# Patient Record
Sex: Female | Born: 1975
Health system: Southern US, Community
[De-identification: ages and names within clinical notes are randomized; demographics above are authoritative.]

## PROBLEM LIST (undated history)

## (undated) DIAGNOSIS — J329 Chronic sinusitis, unspecified: Secondary | ICD-10-CM

## (undated) HISTORY — PX: TONSILLECTOMY: SUR1361

---

## 1999-07-31 ENCOUNTER — Emergency Department (HOSPITAL_COMMUNITY): Admission: EM | Admit: 1999-07-31 | Discharge: 1999-07-31 | Payer: Self-pay | Admitting: Emergency Medicine

## 1999-11-06 ENCOUNTER — Emergency Department (HOSPITAL_COMMUNITY): Admission: EM | Admit: 1999-11-06 | Discharge: 1999-11-06 | Payer: Self-pay | Admitting: Emergency Medicine

## 1999-11-16 ENCOUNTER — Emergency Department (HOSPITAL_COMMUNITY): Admission: EM | Admit: 1999-11-16 | Discharge: 1999-11-16 | Payer: Self-pay | Admitting: Emergency Medicine

## 1999-11-19 ENCOUNTER — Emergency Department (HOSPITAL_COMMUNITY): Admission: EM | Admit: 1999-11-19 | Discharge: 1999-11-19 | Payer: Self-pay | Admitting: Emergency Medicine

## 2000-01-13 ENCOUNTER — Encounter: Payer: Self-pay | Admitting: Family Medicine

## 2000-01-13 ENCOUNTER — Ambulatory Visit (HOSPITAL_COMMUNITY): Admission: RE | Admit: 2000-01-13 | Discharge: 2000-01-13 | Payer: Self-pay | Admitting: Family Medicine

## 2000-03-03 ENCOUNTER — Encounter: Payer: Self-pay | Admitting: Family Medicine

## 2000-03-03 ENCOUNTER — Ambulatory Visit (HOSPITAL_COMMUNITY): Admission: RE | Admit: 2000-03-03 | Discharge: 2000-03-03 | Payer: Self-pay | Admitting: Family Medicine

## 2000-05-20 ENCOUNTER — Inpatient Hospital Stay (HOSPITAL_COMMUNITY): Admission: AD | Admit: 2000-05-20 | Discharge: 2000-05-20 | Payer: Self-pay | Admitting: Family Medicine

## 2000-05-23 ENCOUNTER — Observation Stay (HOSPITAL_COMMUNITY): Admission: AD | Admit: 2000-05-23 | Discharge: 2000-05-24 | Payer: Self-pay | Admitting: Family Medicine

## 2000-05-24 ENCOUNTER — Encounter: Payer: Self-pay | Admitting: Family Medicine

## 2000-06-22 ENCOUNTER — Encounter: Payer: Self-pay | Admitting: Family Medicine

## 2000-06-22 ENCOUNTER — Ambulatory Visit (HOSPITAL_COMMUNITY): Admission: RE | Admit: 2000-06-22 | Discharge: 2000-06-22 | Payer: Self-pay | Admitting: Family Medicine

## 2000-07-29 ENCOUNTER — Observation Stay (HOSPITAL_COMMUNITY): Admission: AD | Admit: 2000-07-29 | Discharge: 2000-07-29 | Payer: Self-pay | Admitting: Family Medicine

## 2000-08-01 ENCOUNTER — Inpatient Hospital Stay (HOSPITAL_COMMUNITY): Admission: AD | Admit: 2000-08-01 | Discharge: 2000-08-03 | Payer: Self-pay | Admitting: Family Medicine

## 2001-04-04 ENCOUNTER — Encounter: Payer: Self-pay | Admitting: Emergency Medicine

## 2001-04-04 ENCOUNTER — Emergency Department (HOSPITAL_COMMUNITY): Admission: EM | Admit: 2001-04-04 | Discharge: 2001-04-04 | Payer: Self-pay | Admitting: Emergency Medicine

## 2001-05-09 ENCOUNTER — Inpatient Hospital Stay (HOSPITAL_COMMUNITY): Admission: AD | Admit: 2001-05-09 | Discharge: 2001-05-09 | Payer: Self-pay | Admitting: Family Medicine

## 2001-05-10 ENCOUNTER — Other Ambulatory Visit: Admission: RE | Admit: 2001-05-10 | Discharge: 2001-05-10 | Payer: Self-pay | Admitting: Family Medicine

## 2003-07-19 ENCOUNTER — Emergency Department (HOSPITAL_COMMUNITY): Admission: EM | Admit: 2003-07-19 | Discharge: 2003-07-19 | Payer: Self-pay | Admitting: Emergency Medicine

## 2004-02-07 ENCOUNTER — Other Ambulatory Visit: Admission: RE | Admit: 2004-02-07 | Discharge: 2004-02-07 | Payer: Self-pay | Admitting: Family Medicine

## 2004-09-11 ENCOUNTER — Emergency Department (HOSPITAL_COMMUNITY): Admission: EM | Admit: 2004-09-11 | Discharge: 2004-09-11 | Payer: Self-pay | Admitting: Family Medicine

## 2005-02-04 ENCOUNTER — Ambulatory Visit (HOSPITAL_COMMUNITY): Admission: RE | Admit: 2005-02-04 | Discharge: 2005-02-04 | Payer: Self-pay | Admitting: Obstetrics and Gynecology

## 2005-04-05 ENCOUNTER — Ambulatory Visit (HOSPITAL_COMMUNITY): Admission: RE | Admit: 2005-04-05 | Discharge: 2005-04-05 | Payer: Self-pay | Admitting: Obstetrics and Gynecology

## 2006-01-10 ENCOUNTER — Emergency Department (HOSPITAL_COMMUNITY): Admission: EM | Admit: 2006-01-10 | Discharge: 2006-01-10 | Payer: Self-pay | Admitting: Family Medicine

## 2006-11-05 ENCOUNTER — Emergency Department (HOSPITAL_COMMUNITY): Admission: EM | Admit: 2006-11-05 | Discharge: 2006-11-05 | Payer: Self-pay | Admitting: Emergency Medicine

## 2007-06-04 ENCOUNTER — Emergency Department (HOSPITAL_COMMUNITY): Admission: EM | Admit: 2007-06-04 | Discharge: 2007-06-04 | Payer: Self-pay | Admitting: Family Medicine

## 2008-01-24 ENCOUNTER — Emergency Department (HOSPITAL_COMMUNITY): Admission: EM | Admit: 2008-01-24 | Discharge: 2008-01-24 | Payer: Self-pay | Admitting: Emergency Medicine

## 2009-07-14 ENCOUNTER — Emergency Department (HOSPITAL_COMMUNITY): Admission: EM | Admit: 2009-07-14 | Discharge: 2009-07-14 | Payer: Self-pay | Admitting: Emergency Medicine

## 2009-08-10 ENCOUNTER — Emergency Department (HOSPITAL_COMMUNITY): Admission: EM | Admit: 2009-08-10 | Discharge: 2009-08-10 | Payer: Self-pay | Admitting: Emergency Medicine

## 2010-10-09 ENCOUNTER — Emergency Department (HOSPITAL_COMMUNITY)
Admission: EM | Admit: 2010-10-09 | Discharge: 2010-10-09 | Payer: Self-pay | Source: Home / Self Care | Admitting: Emergency Medicine

## 2010-12-30 LAB — POCT PREGNANCY, URINE: Preg Test, Ur: NEGATIVE

## 2011-02-12 NOTE — Discharge Summary (Signed)
[facility]  Patient:    Anne Booth, Anne Booth                          MRN: [id number] Adm. Date:  [id number] Disc. Date: [id number] Attending:  Orpah Greek                           Discharge Summary  DATE OF BIRTH:                Dec 20, 1975  ADMISSION DIAGNOSIS:          Twenty-nine and 6/7 weeks known beta strep                               carrier preterm labor.  DISCHARGE DIAGNOSIS:          Thirty weeks pregnant preterm labor, unstable                               known beta strep carrier.  PROCEDURES:                   Ultrasound on May 24, 2000 revealing a 30 week fetus, estimated weight of 1481 grams which is 38% for 30 weeks.  AFI of 10.6 with an anterior placenta grade 2 well above the cervix.  Cervical length was 3.2 cm transvaginally.  No abnormalities were seen.  The patient was admitted after routine follow up in the office to assess the cervical length.  The patient had been treated with Amoxicillin two weeks prior for cervical softening, and had been evaluated in [facility] Triage three days prior with complaints of uterine cramping and contractions. The patient was found to have no cervical change at that visit and no uterine activity and was discharged home on no medications.  She did not take Amoxicillin that was prescribed regularly, at most two times a day and had not finished the treatment course at the time of presentation yesterday.  Was found to have a cervical exam of 1 cm dilated to the internal os, long and soft and was admitted for IV antibiotics, possible tocolysis and possible betamethasone.  The patient was started on Unasyn 3 grams IV q.6h. and observed on continuous fetal and toco monitor.  She exhibited no uterine activity whatsoever during hospitalization and received no tocolysis or betamethasone.  After 12 hours in the hospital, cervical exam remained stable and a tight 1 cm long and moderately firm  without any lower uterine segment development. It was felt that patient could be cared for in a home setting with home IV antibiotics and bedrest, back/pelvic rest.  PLAN AT TIME OF ADMISSION:    Discharge was discussed with Dr. Corky Sox, attending obstetrician per hospital admitting privileges.  The patient was discharged home on Unasyn 3 gm IV q.6h. through home health, strict bed and pelvic rest and was to follow up in the office on Friday, May 27, 2000 at 10:45 a.m., sooner if uterine activity developed. DD:  05/24/00 TD:  05/25/00 Job: [id number]

## 2013-06-19 ENCOUNTER — Encounter (HOSPITAL_BASED_OUTPATIENT_CLINIC_OR_DEPARTMENT_OTHER): Payer: Self-pay | Admitting: *Deleted

## 2013-06-19 ENCOUNTER — Emergency Department (HOSPITAL_BASED_OUTPATIENT_CLINIC_OR_DEPARTMENT_OTHER): Payer: Medicaid Other

## 2013-06-19 ENCOUNTER — Emergency Department (HOSPITAL_BASED_OUTPATIENT_CLINIC_OR_DEPARTMENT_OTHER)
Admission: EM | Admit: 2013-06-19 | Discharge: 2013-06-19 | Disposition: A | Discharge status: Home / Self Care | Payer: Medicaid Other | Attending: Emergency Medicine | Admitting: Emergency Medicine

## 2013-06-19 DIAGNOSIS — Z79899 Other long term (current) drug therapy: Secondary | ICD-10-CM | POA: Insufficient documentation

## 2013-06-19 DIAGNOSIS — F17200 Nicotine dependence, unspecified, uncomplicated: Secondary | ICD-10-CM | POA: Insufficient documentation

## 2013-06-19 DIAGNOSIS — R079 Chest pain, unspecified: Secondary | ICD-10-CM | POA: Insufficient documentation

## 2013-06-19 DIAGNOSIS — J40 Bronchitis, not specified as acute or chronic: Secondary | ICD-10-CM

## 2013-06-19 MED ORDER — IBUPROFEN 800 MG PO TABS
800.0000 mg | ORAL_TABLET | Freq: Once | ORAL | Status: AC
  Administered 2013-06-19: 800 mg via ORAL
  Filled 2013-06-19: qty 1

## 2013-06-19 MED ORDER — ALBUTEROL SULFATE (5 MG/ML) 0.5% IN NEBU
5.0000 mg | INHALATION_SOLUTION | Freq: Once | RESPIRATORY_TRACT | Status: AC
  Administered 2013-06-19: 5 mg via RESPIRATORY_TRACT

## 2013-06-19 MED ORDER — ALBUTEROL SULFATE (5 MG/ML) 0.5% IN NEBU
INHALATION_SOLUTION | RESPIRATORY_TRACT | Status: AC
  Filled 2013-06-19: qty 1

## 2013-06-19 MED ORDER — ALBUTEROL SULFATE HFA 108 (90 BASE) MCG/ACT IN AERS
2.0000 | INHALATION_SPRAY | Freq: Once | RESPIRATORY_TRACT | Status: AC
  Administered 2013-06-19: 2 via RESPIRATORY_TRACT
  Filled 2013-06-19: qty 6.7

## 2013-06-19 MED ORDER — ALBUTEROL SULFATE HFA 108 (90 BASE) MCG/ACT IN AERS: 2.0000 | INHALATION_SPRAY | RESPIRATORY_TRACT | Status: DC | PRN

## 2013-06-19 MED ORDER — IPRATROPIUM BROMIDE 0.02 % IN SOLN
0.5000 mg | Freq: Once | RESPIRATORY_TRACT | Status: AC
  Administered 2013-06-19: 0.5 mg via RESPIRATORY_TRACT
  Filled 2013-06-19: qty 2.5

## 2013-06-19 NOTE — Patient Instructions (Signed)
Instructed patient on the proper use of administering albuteral mdi via aerochamber patient tolerated well 

## 2013-06-19 NOTE — ED Notes (Signed)
Reports she has been sick for 2 weeks with cough and congestion- Tonight reports increased SOB, chest pain with cough and deep breath

## 2013-06-19 NOTE — ED Provider Notes (Signed)
CSN: [id number]     Arrival date & time 06/19/13  2124 History   This chart was scribed for Audree Camel, MD by Valera Castle, ED Scribe. This patient was seen in room MH10/MH10 and the patient's care was started at 9:42 PM.    Chief Complaint  Patient presents with  . Shortness of Breath    Patient is a 37 y.o. female presenting with shortness of breath. The history is provided by the patient. No language interpreter was used.  Shortness of Breath Severity:  Moderate Onset quality:  Sudden Duration: Earlier tonight. Timing:  Intermittent Chronicity:  New Context comment:  Recent congestion and cough. Pt is a PPD smoker.  Associated symptoms: chest pain and cough   Associated symptoms: no fever (Pt reports feeling warm today. No temp recorded.)   Associated symptoms comment:  Congestion. Chest pain:    Severity:  Moderate   Onset quality:  Gradual   Duration:  2 weeks   Timing:  Intermittent   Chronicity:  New  HPI Comments: Anne Booth is a 37 y.o. female who presents to the Emergency Department complaining of gradually increasing, moderate, intermittent SOB, onset earlier tonight. She reports associated congestion and central chest pain when coughing and deep breathing, onset 2 weeks ago. She reports that the coughing was unproductive, and that the congestion started in her sinuses, and then gradually moved down into her chest. She denies currently having congestion in her sinuses. She states that she started feeling better last week, but that the symptoms worsened again. She reports that movement and activity excaberates the SOB and chest pain. She states she felt warm earlier today and that she might have a fever. She states that she used her inhaler initially, with some relief, but then stopped using it. She also reports taking mucinex and nyquil, with no relief. She reports having bronchitis years ago and that this current pain is much worse than that, but denies a h/o COPD. She  denies rhinorrhea, and any other associated symptoms. She has no known allergies, and no pertinent medical history. She is a PPD smoker, but denies EtOH.    History reviewed. No pertinent past medical history. Past Surgical History  Procedure Laterality Date  . Tonsillectomy     No family history on file. History  Substance Use Topics  . Smoking status: Current Every Day Smoker -- 1.00 packs/day    Types: Cigarettes  . Smokeless tobacco: Never Used  . Alcohol Use: No   OB History   Grav Para Term Preterm Abortions TAB SAB Ect Mult Living                 Review of Systems  Constitutional: Negative for fever (Pt reports feeling warm today. No temp recorded.).  HENT: Positive for congestion. Negative for rhinorrhea.   Respiratory: Positive for cough and shortness of breath.   Cardiovascular: Positive for chest pain.  All other systems reviewed and are negative.    Allergies  Review of patient's allergies indicates no known allergies.  Home Medications   Current Outpatient Rx  Name  Route  Sig  Dispense  Refill  . guaiFENesin (MUCINEX) 600 MG 12 hr tablet   Oral   Take 1,200 mg by mouth 2 (two) times daily.         . Pseudoeph-Doxylamine-DM-APAP (NYQUIL PO)   Oral   Take by mouth.          Triage Vitals: BP 153/89  Pulse 94  Temp(Src)  98 F (36.7 C) (Oral)  Resp 16  Ht 5\' 11"  (1.803 m)  Wt 260 lb (117.935 kg)  BMI 36.28 kg/m2  SpO2 94%  LMP 05/11/2013  Physical Exam  Nursing note and vitals reviewed. Constitutional: She is oriented to person, place, and time. She appears well-developed and well-nourished. No distress.  HENT:  Head: Normocephalic and atraumatic.  Mouth/Throat: Oropharynx is clear and moist.  Oral pharynx clear. No erythema. No nasal congestion.   Eyes: EOM are normal.  Neck: Neck supple. No tracheal deviation present.  Cardiovascular: Normal rate, regular rhythm and normal heart sounds.   Pulmonary/Chest: Effort normal. No respiratory  distress. She has wheezes (Diffuse wheezing. ). She exhibits no tenderness.  Abdominal: Soft. There is no tenderness.  No abdominal tenderness.   Musculoskeletal: Normal range of motion.  Neurological: She is alert and oriented to person, place, and time.  Skin: Skin is warm and dry.  Psychiatric: She has a normal mood and affect. Her behavior is normal.    ED Course  Procedures (including critical care time)  DIAGNOSTIC STUDIES: Oxygen Saturation is 94% on room air, adequate by my interpretation.    COORDINATION OF CARE: 9:45 PM-Discussed treatment plan which includes CXR, EKG, ibuprofen, albuterol, and ipratropium with pt at bedside and pt agreed to plan.     Date: 06/19/2013  Rate: 75  Rhythm: normal sinus rhythm  QRS Axis: normal  Intervals: normal  ST/T Wave abnormalities: normal  Conduction Disutrbances:none  Narrative Interpretation: Normal EKG  Old EKG Reviewed: none available   Labs Review Labs Reviewed - No data to display Imaging Review Dg Chest 2 View  06/19/2013   CLINICAL DATA:  Shortness of breath, wheezing, cough and chest pain.  EXAM: CHEST  2 VIEW  COMPARISON:  None.  FINDINGS: Normal heart size. Approximately 2 cm with thin edge along the left upper mediastinum. Central bronchitic changes without focal infiltrate, edema, effusion, or pneumothorax.  IMPRESSION: 1. Bronchitic changes. 2. Probable bleb along the upper left mediastinum.   Electronically Signed   By: Tiburcio Pea   On: 06/19/2013 22:59    MDM   1. Bronchitis    Symptomatically improved with NSAIDs and albuterol treatment. Her EKG is not concerning for ACS. Her symptoms this is with a viral URI. Will treat symptomatically with bronchitis but there is no sign of bacterial pneumonia. Will treat with albuterol at home as well as other supportive care measures. Discussed smoking cessation as well. Her airways opened up significantly after 2 albuterol treatments. Will give an inhaler here for use at  home.    I personally performed the services described in this documentation, which was scribed in my presence. The recorded information has been reviewed and is accurate.    Audree Camel, MD 06/19/13 2308

## 2013-08-29 ENCOUNTER — Encounter (HOSPITAL_BASED_OUTPATIENT_CLINIC_OR_DEPARTMENT_OTHER): Payer: Self-pay | Admitting: Emergency Medicine

## 2013-08-29 ENCOUNTER — Emergency Department (HOSPITAL_BASED_OUTPATIENT_CLINIC_OR_DEPARTMENT_OTHER): Payer: Medicaid Other

## 2013-08-29 ENCOUNTER — Emergency Department (HOSPITAL_BASED_OUTPATIENT_CLINIC_OR_DEPARTMENT_OTHER)
Admission: EM | Admit: 2013-08-29 | Discharge: 2013-08-29 | Disposition: A | Discharge status: Home / Self Care | Payer: Medicaid Other | Attending: Emergency Medicine | Admitting: Emergency Medicine

## 2013-08-29 DIAGNOSIS — F17200 Nicotine dependence, unspecified, uncomplicated: Secondary | ICD-10-CM | POA: Insufficient documentation

## 2013-08-29 DIAGNOSIS — R109 Unspecified abdominal pain: Secondary | ICD-10-CM | POA: Insufficient documentation

## 2013-08-29 DIAGNOSIS — M549 Dorsalgia, unspecified: Secondary | ICD-10-CM | POA: Insufficient documentation

## 2013-08-29 DIAGNOSIS — Z3202 Encounter for pregnancy test, result negative: Secondary | ICD-10-CM | POA: Insufficient documentation

## 2013-08-29 DIAGNOSIS — Z79899 Other long term (current) drug therapy: Secondary | ICD-10-CM | POA: Insufficient documentation

## 2013-08-29 LAB — PREGNANCY, URINE: Preg Test, Ur: NEGATIVE

## 2013-08-29 LAB — URINALYSIS, ROUTINE W REFLEX MICROSCOPIC
Glucose, UA: NEGATIVE mg/dL
Hgb urine dipstick: NEGATIVE
Specific Gravity, Urine: 1.006 (ref 1.005–1.030)
pH: 6.5 (ref 5.0–8.0)

## 2013-08-29 LAB — URINE MICROSCOPIC-ADD ON

## 2013-08-29 MED ORDER — IBUPROFEN 800 MG PO TABS: 800.0000 mg | ORAL_TABLET | Freq: Three times a day (TID) | ORAL | Status: DC

## 2013-08-29 MED ORDER — HYDROCODONE-ACETAMINOPHEN 5-325 MG PO TABS: 2.0000 | ORAL_TABLET | ORAL | Status: DC | PRN

## 2013-08-29 MED ORDER — HYDROCODONE-ACETAMINOPHEN 5-325 MG PO TABS
2.0000 | ORAL_TABLET | Freq: Once | ORAL | Status: AC
  Administered 2013-08-29: 2 via ORAL
  Filled 2013-08-29: qty 2

## 2013-08-29 NOTE — ED Provider Notes (Signed)
CSN: [id number]     Arrival date & time 08/29/13  1743 History   First MD Initiated Contact with Patient 08/29/13 1752     Chief Complaint  Patient presents with  . Urinary Frequency   (Consider location/radiation/quality/duration/timing/severity/associated sxs/prior Treatment) Patient is a 37 y.o. female presenting with frequency. The history is provided by the patient. No language interpreter was used.  Urinary Frequency This is a new problem. The current episode started today. The problem occurs constantly. The problem has been unchanged. Associated symptoms include abdominal pain. Nothing aggravates the symptoms. She has tried nothing for the symptoms. The treatment provided moderate relief.  Pt reports she ha had lower abdominal pain for 3 days.   History reviewed. No pertinent past medical history. Past Surgical History  Procedure Laterality Date  . Tonsillectomy     History reviewed. No pertinent family history. History  Substance Use Topics  . Smoking status: Current Every Day Smoker -- 1.00 packs/day    Types: Cigarettes  . Smokeless tobacco: Never Used  . Alcohol Use: No   OB History   Grav Para Term Preterm Abortions TAB SAB Ect Mult Living                 Review of Systems  Gastrointestinal: Positive for abdominal pain.  Genitourinary: Positive for frequency.  Musculoskeletal: Positive for back pain.  All other systems reviewed and are negative.    Allergies  Review of patient's allergies indicates no known allergies.  Home Medications   Current Outpatient Rx  Name  Route  Sig  Dispense  Refill  . albuterol (PROVENTIL HFA;VENTOLIN HFA) 108 (90 BASE) MCG/ACT inhaler   Inhalation   Inhale 2 puffs into the lungs every 4 (four) hours as needed for wheezing or shortness of breath.   1 Inhaler   0   . guaiFENesin (MUCINEX) 600 MG 12 hr tablet   Oral   Take 1,200 mg by mouth 2 (two) times daily.         . Pseudoeph-Doxylamine-DM-APAP (NYQUIL PO)    Oral   Take by mouth.          BP 148/86  Pulse 87  Temp(Src) 98.3 F (36.8 C) (Oral)  Resp 16  Ht 5\' 11"  (1.803 m)  Wt 250 lb (113.399 kg)  BMI 34.88 kg/m2  SpO2 100% Physical Exam  Nursing note and vitals reviewed. Constitutional: She appears well-developed and well-nourished.  HENT:  Head: Normocephalic and atraumatic.  Right Ear: External ear normal.  Left Ear: External ear normal.  Mouth/Throat: Oropharynx is clear and moist.  Eyes: Pupils are equal, round, and reactive to light.  Neck: Normal range of motion.  Cardiovascular: Normal rate and regular rhythm.   Pulmonary/Chest: Effort normal and breath sounds normal.  Abdominal: Soft. There is tenderness.  cva tenderness right flank  Musculoskeletal: Normal range of motion.  Neurological: She is alert.  Skin: Skin is warm.  Psychiatric: She has a normal mood and affect.    ED Course  Procedures (including critical care time) Labs Review Labs Reviewed  URINALYSIS, ROUTINE W REFLEX MICROSCOPIC - Abnormal; Notable for the following:    Leukocytes, UA TRACE (*)    All other components within normal limits  PREGNANCY, URINE  URINE MICROSCOPIC-ADD ON   Imaging Review No results found.  EKG Interpretation   None      Results for orders placed during the hospital encounter of 08/29/13  URINALYSIS, ROUTINE W REFLEX MICROSCOPIC      Result Value  Range   Color, Urine YELLOW  YELLOW   APPearance CLEAR  CLEAR   Specific Gravity, Urine 1.006  1.005 - 1.030   pH 6.5  5.0 - 8.0   Glucose, UA NEGATIVE  NEGATIVE mg/dL   Hgb urine dipstick NEGATIVE  NEGATIVE   Bilirubin Urine NEGATIVE  NEGATIVE   Ketones, ur NEGATIVE  NEGATIVE mg/dL   Protein, ur NEGATIVE  NEGATIVE mg/dL   Urobilinogen, UA 0.2  0.0 - 1.0 mg/dL   Nitrite NEGATIVE  NEGATIVE   Leukocytes, UA TRACE (*) NEGATIVE  PREGNANCY, URINE      Result Value Range   Preg Test, Ur NEGATIVE  NEGATIVE  URINE MICROSCOPIC-ADD ON      Result Value Range   Squamous  Epithelial / LPF RARE  RARE   WBC, UA 0-2  <3 WBC/hpf   RBC / HPF 0-2  <3 RBC/hpf   Bacteria, UA RARE  RARE   Urine-Other MUCOUS PRESENT     Ct Abdomen Pelvis Wo Contrast  08/29/2013   CLINICAL DATA:  Right flank pain and lower abdominal pain  EXAM: CT ABDOMEN AND PELVIS WITHOUT CONTRAST  TECHNIQUE: Multidetector CT imaging of the abdomen and pelvis was performed following the standard protocol without intravenous contrast.  COMPARISON:  None  FINDINGS: There is no pleural effusion identified. The lung bases appear clear.  No focal liver abnormality identified. The gallbladder appears normal. No biliary dilatation. The pancreas appears normal. The spleen is unremarkable.  Normal appearance of the adrenal glands. There is a stone within the inferior pole of the right kidney measuring 2 mm, image 45/ series 2. No right-sided hydronephrosis or hydroureter. The left kidney appears normal. No left-sided nephrolithiasis or hydronephrosis. The urinary bladder appears within normal limits. The uterus and the adnexal structures have a normal physiologic appearance for patient's age.  Normal caliber of the abdominal aorta. There is no aneurysm. There is no upper abdominal adenopathy.  The stomach and small bowel loops appear normal. The appendix is visualized and appears normal. Normal appearance of the proximal colon. A few scattered distal colonic diverticula noted without acute inflammation.  No free fluid or fluid collections identified within the abdomen or pelvis.  Review of the visualized osseous structures is negative.  IMPRESSION: 1. No acute findings. 2. Nonobstructing right renal calculus.   Electronically Signed   By: Signa Kell M.D.   On: 08/29/2013 19:09    MDM   1. Back pain     Pt given 2 hydrocodone for pain.   I advised no stone.   Pt given rx for ibuprofen and hydrocodone.   Pt given physicain referrals  Elson Areas, PA-C 08/29/13 1923

## 2013-08-29 NOTE — ED Notes (Signed)
Pt c/o urinary freq and lower abd pain x 3 days

## 2013-08-29 NOTE — ED Provider Notes (Signed)
Medical screening examination/treatment/procedure(s) were performed by non-physician practitioner and as supervising physician I was immediately available for consultation/collaboration.  EKG Interpretation   None         Gwyneth Sprout, MD 08/29/13 2339

## 2014-05-02 ENCOUNTER — Emergency Department (HOSPITAL_COMMUNITY)
Admission: EM | Admit: 2014-05-02 | Discharge: 2014-05-02 | Disposition: A | Discharge status: Home / Self Care | Payer: Medicaid Other | Attending: Emergency Medicine | Admitting: Emergency Medicine

## 2014-05-02 ENCOUNTER — Encounter (HOSPITAL_COMMUNITY): Payer: Self-pay | Admitting: Emergency Medicine

## 2014-05-02 DIAGNOSIS — S99919A Unspecified injury of unspecified ankle, initial encounter: Secondary | ICD-10-CM

## 2014-05-02 DIAGNOSIS — F17200 Nicotine dependence, unspecified, uncomplicated: Secondary | ICD-10-CM | POA: Insufficient documentation

## 2014-05-02 DIAGNOSIS — S91109A Unspecified open wound of unspecified toe(s) without damage to nail, initial encounter: Secondary | ICD-10-CM | POA: Diagnosis not present

## 2014-05-02 DIAGNOSIS — S8990XA Unspecified injury of unspecified lower leg, initial encounter: Secondary | ICD-10-CM | POA: Diagnosis present

## 2014-05-02 DIAGNOSIS — Y9389 Activity, other specified: Secondary | ICD-10-CM | POA: Insufficient documentation

## 2014-05-02 DIAGNOSIS — W230XXA Caught, crushed, jammed, or pinched between moving objects, initial encounter: Secondary | ICD-10-CM | POA: Insufficient documentation

## 2014-05-02 DIAGNOSIS — S91209A Unspecified open wound of unspecified toe(s) with damage to nail, initial encounter: Secondary | ICD-10-CM

## 2014-05-02 DIAGNOSIS — Y9289 Other specified places as the place of occurrence of the external cause: Secondary | ICD-10-CM | POA: Insufficient documentation

## 2014-05-02 DIAGNOSIS — S99929A Unspecified injury of unspecified foot, initial encounter: Secondary | ICD-10-CM

## 2014-05-02 MED ORDER — IBUPROFEN 800 MG PO TABS
800.0000 mg | ORAL_TABLET | Freq: Once | ORAL | Status: AC
  Administered 2014-05-02: 800 mg via ORAL
  Filled 2014-05-02: qty 1

## 2014-05-02 MED ORDER — HYDROCODONE-ACETAMINOPHEN 5-325 MG PO TABS: 1.0000 | ORAL_TABLET | ORAL | Status: DC | PRN

## 2014-05-02 MED ORDER — LIDOCAINE HCL (PF) 1 % IJ SOLN
INTRAMUSCULAR | Status: AC
  Filled 2014-05-02: qty 5

## 2014-05-02 MED ORDER — LIDOCAINE HCL (PF) 1 % IJ SOLN
5.0000 mL | Freq: Once | INTRAMUSCULAR | Status: AC
  Administered 2014-05-02: 5 mL

## 2014-05-02 MED ORDER — HYDROCODONE-ACETAMINOPHEN 5-325 MG PO TABS
2.0000 | ORAL_TABLET | Freq: Once | ORAL | Status: AC
  Administered 2014-05-02: 2 via ORAL
  Filled 2014-05-02: qty 2

## 2014-05-02 NOTE — ED Provider Notes (Signed)
Medical screening examination/treatment/procedure(s) were performed by non-physician practitioner and as supervising physician I was immediately available for consultation/collaboration.   EKG Interpretation None        Kiyoko Mcguirt, MD 05/02/14 1955 

## 2014-05-02 NOTE — ED Provider Notes (Signed)
CSN: [id number]     Arrival date & time 05/02/14  1627 History   First MD Initiated Contact with Patient 05/02/14 1714     Chief Complaint  Patient presents with  . Toe Injury     (Consider location/radiation/quality/duration/timing/severity/associated sxs/prior Treatment) HPI Comments: Patient states she and her husband were moving an air conditioner across the room , when her foot got tangled up in her husband's shoot and injured the left toenail. The patient states there was a great deal of bleeding present. The nail was almost completely removed from the toe, but the portion was stuck in place. The patient reports some pain and discomfort. She presents at this time for assistance with the removal of the toenail.  The history is provided by the patient.    History reviewed. No pertinent past medical history. Past Surgical History  Procedure Laterality Date  . Tonsillectomy     No family history on file. History  Substance Use Topics  . Smoking status: Current Every Day Smoker -- 1.00 packs/day    Types: Cigarettes  . Smokeless tobacco: Never Used  . Alcohol Use: No   OB History   Grav Para Term Preterm Abortions TAB SAB Ect Mult Living                 Review of Systems  Constitutional: Negative for activity change.       All ROS Neg except as noted in HPI  HENT: Negative for nosebleeds.   Eyes: Negative for photophobia and discharge.  Respiratory: Negative for cough, shortness of breath and wheezing.   Cardiovascular: Negative for chest pain and palpitations.  Gastrointestinal: Negative for abdominal pain and blood in stool.  Genitourinary: Negative for dysuria, frequency and hematuria.  Musculoskeletal: Negative for arthralgias, back pain and neck pain.  Skin: Negative.   Neurological: Negative for dizziness, seizures and speech difficulty.  Psychiatric/Behavioral: Negative for hallucinations and confusion.      Allergies  Review of patient's allergies indicates  no known allergies.  Home Medications   Prior to Admission medications   Not on File   BP 142/80  Pulse 88  Temp(Src) 98.4 F (36.9 C) (Oral)  Resp 14  SpO2 96%  LMP 04/10/2014 Physical Exam  Nursing note and vitals reviewed. Constitutional: She is oriented to person, place, and time. She appears well-developed and well-nourished.  Non-toxic appearance.  HENT:  Head: Normocephalic.  Right Ear: Tympanic membrane and external ear normal.  Left Ear: Tympanic membrane and external ear normal.  Eyes: EOM and lids are normal. Pupils are equal, round, and reactive to light.  Neck: Normal range of motion. Neck supple. Carotid bruit is not present.  Cardiovascular: Normal rate, regular rhythm, normal heart sounds, intact distal pulses and normal pulses.   Pulmonary/Chest: Breath sounds normal. No respiratory distress.  Abdominal: Soft. Bowel sounds are normal. There is no tenderness. There is no guarding.  Musculoskeletal: Normal range of motion.  The nail of the left first toe is partially removed. Mild bleeding noted. FROM of the left first toe. DP pulse 2+  Lymphadenopathy:       Head (right side): No submandibular adenopathy present.       Head (left side): No submandibular adenopathy present.    She has no cervical adenopathy.  Neurological: She is alert and oriented to person, place, and time. She has normal strength. No cranial nerve deficit or sensory deficit.  Skin: Skin is warm and dry.  Psychiatric: She has a normal mood and  affect. Her speech is normal.    ED Course  NAIL REMOVAL Date/Time: 05/02/2014 6:03 PM Performed by: Kathie DikeBRYANT, Yani Coventry M Authorized by: Kathie DikeBRYANT, Hillard Goodwine M Consent: Verbal consent obtained. Risks and benefits: risks, benefits and alternatives were discussed Patient understanding: patient states understanding of the procedure being performed Patient identity confirmed: arm band Time out: Immediately prior to procedure a "time out" was called to verify the  correct patient, procedure, equipment, support staff and site/side marked as required. Location: left foot Location details: left big toe Anesthesia: digital block Local anesthetic: lidocaine 1% without epinephrine Patient sedated: no Preparation: skin prepped with ChloraPrep and sterile field established Amount removed: complete Nail bed sutured: no Removed nail replaced and anchored: no Dressing: gauze roll Patient tolerance: Patient tolerated the procedure well with no immediate complications.   (including critical care time) Labs Review Labs Reviewed - No data to display  Imaging Review No results found.   EKG Interpretation None      MDM Vital signs are well within normal limits. The toenail was removed without any problem. The patient received a bandage. She was treated in the emergency department with pain medication. Prescription for Norco given for any remaining discomfort.    Final diagnoses:  None    **I have reviewed nursing notes, vital signs, and all appropriate lab and imaging results for this patient. ` `  Kathie DikeHobson M Juwann Sherk, PA-C 05/02/14 1820

## 2014-05-02 NOTE — ED Notes (Signed)
Pt states left great toenail pulled off while moving furniture.

## 2014-05-02 NOTE — Discharge Instructions (Signed)
Please cleanse the wound area of the toe with soap and water daily. Please apply a bandage daily. Please use clean white socks until the wound has healed. Please see your primary physician or return to the emergency department if any pus drainage, red streaks, or signs of advancing infection. Use Tylenol or ibuprofen for mild discomfort. Use Norco for more severe pain. This medication may cause drowsiness, please use with caution. Fingernail or Toenail Loss All or part of your fingernail or toenail has been lost. This may or may not grow back as a normal nail. A special non-stick bandage has been put on your finger or toe tightly to prevent bleeding. HOME CARE INSTRUCTIONS  The tips of fingers and toes are full of nerves and injuries are often very painful. The following will help you decrease the pain and obtain the best outcome.  Keep your hand or foot elevated above your heart to relieve pain and swelling. This will require lying in bed or on a couch with the hand or leg on pillows or sitting in a recliner with the leg up. Letting your hand or leg dangle may increase swelling, slow healing and cause throbbing pain.  Keep your dressing dry and clean.  Change your bandage in 24 hours after going home.  After your bandage is changed, soak your hand or foot in warm soapy water for 10 to 20 minutes. Do this 3 times per day. This helps reduce pain and swelling. After soaking, apply a clean, dry bandage. Change your bandage if it is wet or dirty.  Only take over-the-counter or prescription medicines for pain, discomfort, or fever as directed by your caregiver.  See your caregiver as needed for problems. SEEK IMMEDIATE MEDICAL CARE IF:   You have increased pain, swelling, drainage, or bleeding.  You have a fever. MAKE SURE YOU:   Understand these instructions.  Will watch your condition.  Will get help right away if you are not doing well or get worse. Document Released: 08/05/2006 Document  Revised: 12/06/2011 Document Reviewed: 10/25/2006 [facility] Patient Information 2015 [employer] [place], [place]. This information is not intended to replace advice given to you by your health care provider. Make sure you discuss any questions you have with your health care provider.

## 2014-11-22 ENCOUNTER — Emergency Department (HOSPITAL_BASED_OUTPATIENT_CLINIC_OR_DEPARTMENT_OTHER)
Admission: EM | Admit: 2014-11-22 | Discharge: 2014-11-22 | Disposition: A | Discharge status: Home / Self Care | Payer: Medicaid Other | Attending: Emergency Medicine | Admitting: Emergency Medicine

## 2014-11-22 ENCOUNTER — Encounter (HOSPITAL_BASED_OUTPATIENT_CLINIC_OR_DEPARTMENT_OTHER): Payer: Self-pay

## 2014-11-22 DIAGNOSIS — J069 Acute upper respiratory infection, unspecified: Secondary | ICD-10-CM | POA: Diagnosis not present

## 2014-11-22 DIAGNOSIS — J029 Acute pharyngitis, unspecified: Secondary | ICD-10-CM | POA: Diagnosis present

## 2014-11-22 DIAGNOSIS — Z720 Tobacco use: Secondary | ICD-10-CM | POA: Diagnosis not present

## 2014-11-22 HISTORY — DX: Chronic sinusitis, unspecified: J32.9

## 2014-11-22 LAB — RAPID STREP SCREEN (MED CTR MEBANE ONLY): Streptococcus, Group A Screen (Direct): NEGATIVE

## 2014-11-22 MED ORDER — IBUPROFEN 400 MG PO TABS
600.0000 mg | ORAL_TABLET | Freq: Once | ORAL | Status: AC
  Administered 2014-11-22: 600 mg via ORAL
  Filled 2014-11-22 (×2): qty 1

## 2014-11-22 NOTE — ED Provider Notes (Signed)
CSN: [id number]     Arrival date & time 11/22/14  4098 History   First MD Initiated Contact with Patient 11/22/14 [phone]     Chief Complaint  Patient presents with  . Sore Throat     (Consider location/radiation/quality/duration/timing/severity/associated sxs/prior Treatment) HPI  39 year old female presents with a sore throat for the past 4 days. The patient states that her throat hurts bilaterally but got worse this morning. The pain gets so bad sometimes it seems to radiate up into her head and causing headache. Has not had any fevers but has had chills. Has had a mild dry cough but denies shortness of breath. No congestion. No neck swelling. Patient took Tylenol Sinus this morning but feels like is wearing off and her pain is coming back. Has children at home with recent upper respiratory infections.  Past Medical History  Diagnosis Date  . Sinus infection    Past Surgical History  Procedure Laterality Date  . Tonsillectomy     No family history on file. History  Substance Use Topics  . Smoking status: Current Every Day Smoker -- 1.00 packs/day    Types: Cigarettes  . Smokeless tobacco: Never Used  . Alcohol Use: No   OB History    No data available     Review of Systems  Constitutional: Positive for chills and diaphoresis. Negative for fever.  HENT: Positive for ear pain and sore throat. Negative for congestion, rhinorrhea, trouble swallowing and voice change.   Respiratory: Positive for cough. Negative for shortness of breath.   Gastrointestinal: Negative for vomiting.  Neurological: Positive for headaches.  All other systems reviewed and are negative.     Allergies  Review of patient's allergies indicates no known allergies.  Home Medications   Prior to Admission medications   Not on File   BP 128/82 mmHg  Pulse 94  Temp(Src) 98.3 F (36.8 C) (Oral)  Resp 18  Ht  (1.803 m)  Wt 260 lb (117.935 kg)  BMI 36.28 kg/m2  SpO2 99%  LMP  11/19/2014 Physical Exam  Constitutional: She is oriented to person, place, and time. She appears well-developed and well-nourished.  HENT:  Head: Normocephalic and atraumatic.  Right Ear: External ear normal.  Left Ear: External ear normal.  Nose: Nose normal.  Mouth/Throat: Oropharynx is clear and moist. No oropharyngeal exudate.  Eyes: Right eye exhibits no discharge. Left eye exhibits no discharge.  Neck: Normal range of motion. Neck supple.  Cardiovascular: Normal rate, regular rhythm and normal heart sounds.   Pulmonary/Chest: Effort normal and breath sounds normal. No stridor. She has no wheezes.  Abdominal: She exhibits no distension.  Lymphadenopathy:    She has no cervical adenopathy.  Neurological: She is alert and oriented to person, place, and time.  Skin: Skin is warm and dry.  Nursing note and vitals reviewed.   ED Course  Procedures (including critical care time) Labs Review Labs Reviewed  RAPID STREP SCREEN  CULTURE, GROUP A STREP    Imaging Review No results found.   EKG Interpretation None      MDM   Final diagnoses:  Upper respiratory infection    Patient symptoms are consistent with a viral upper respiratory infection. No neck stiffness or rigidity to suggest deep space infection. Normal airway and normal voice. We'll treat symptomatically and recommended follow-up with PCP as an outpatient. No increased work of breathing, hypoxia, or abnormal lung sounds to suggest pneumonia, I do not feel chest x-ray is warranted at this time.  Audree CamelScott T Shaneal Barasch, MD 11/22/14 1340

## 2014-11-22 NOTE — Discharge Instructions (Signed)
Upper Respiratory Infection, Adult An upper respiratory infection (URI) is also sometimes known as the common cold. The upper respiratory tract includes the nose, sinuses, throat, trachea, and bronchi. Bronchi are the airways leading to the lungs. Most people improve within 1 week, but symptoms can last up to 2 weeks. A residual cough may last even longer.  CAUSES Many different viruses can infect the tissues lining the upper respiratory tract. The tissues become irritated and inflamed and often become very moist. Mucus production is also common. A cold is contagious. You can easily spread the virus to others by oral contact. This includes kissing, sharing a glass, coughing, or sneezing. Touching your mouth or nose and then touching a surface, which is then touched by another person, can also spread the virus. SYMPTOMS  Symptoms typically develop 1 to 3 days after you come in contact with a cold virus. Symptoms vary from person to person. They may include:  Runny nose.  Sneezing.  Nasal congestion.  Sinus irritation.  Sore throat.  Loss of voice (laryngitis).  Cough.  Fatigue.  Muscle aches.  Loss of appetite.  Headache.  Low-grade fever. DIAGNOSIS  You might diagnose your own cold based on familiar symptoms, since most people get a cold 2 to 3 times a year. Your caregiver can confirm this based on your exam. Most importantly, your caregiver can check that your symptoms are not due to another disease such as strep throat, sinusitis, pneumonia, asthma, or epiglottitis. Blood tests, throat tests, and X-rays are not necessary to diagnose a common cold, but they may sometimes be helpful in excluding other more serious diseases. Your caregiver will decide if any further tests are required. RISKS AND COMPLICATIONS  You may be at risk for a more severe case of the common cold if you smoke cigarettes, have chronic heart disease (such as heart failure) or lung disease (such as asthma), or if  you have a weakened immune system. The very young and very old are also at risk for more serious infections. Bacterial sinusitis, middle ear infections, and bacterial pneumonia can complicate the common cold. The common cold can worsen asthma and chronic obstructive pulmonary disease (COPD). Sometimes, these complications can require emergency medical care and may be life-threatening. PREVENTION  The best way to protect against getting a cold is to practice good hygiene. Avoid oral or hand contact with people with cold symptoms. Wash your hands often if contact occurs. There is no clear evidence that vitamin C, vitamin E, echinacea, or exercise reduces the chance of developing a cold. However, it is always recommended to get plenty of rest and practice good nutrition. TREATMENT  Treatment is directed at relieving symptoms. There is no cure. Antibiotics are not effective, because the infection is caused by a virus, not by bacteria. Treatment may include:  Increased fluid intake. Sports drinks offer valuable electrolytes, sugars, and fluids.  Breathing heated mist or steam (vaporizer or shower).  Eating chicken soup or other clear broths, and maintaining good nutrition.  Getting plenty of rest.  Using gargles or lozenges for comfort.  Controlling fevers with ibuprofen or acetaminophen as directed by your caregiver.  Increasing usage of your inhaler if you have asthma. Zinc gel and zinc lozenges, taken in the first 24 hours of the common cold, can shorten the duration and lessen the severity of symptoms. Pain medicines may help with fever, muscle aches, and throat pain. A variety of non-prescription medicines are available to treat congestion and runny nose. Your caregiver   can make recommendations and may suggest nasal or lung inhalers for other symptoms.  HOME CARE INSTRUCTIONS   Only take over-the-counter or prescription medicines for pain, discomfort, or fever as directed by your  caregiver.  Use a warm mist humidifier or inhale steam from a shower to increase air moisture. This may keep secretions moist and make it easier to breathe.  Drink enough water and fluids to keep your urine clear or pale yellow.  Rest as needed.  Return to work when your temperature has returned to normal or as your caregiver advises. You may need to stay home longer to avoid infecting others. You can also use a face mask and careful hand washing to prevent spread of the virus. SEEK MEDICAL CARE IF:   After the first few days, you feel you are getting worse rather than better.  You need your caregiver's advice about medicines to control symptoms.  You develop chills, worsening shortness of breath, or brown or red sputum. These may be signs of pneumonia.  You develop yellow or brown nasal discharge or pain in the face, especially when you bend forward. These may be signs of sinusitis.  You develop a fever, swollen neck glands, pain with swallowing, or white areas in the back of your throat. These may be signs of strep throat. SEEK IMMEDIATE MEDICAL CARE IF:   You have a fever.  You develop severe or persistent headache, ear pain, sinus pain, or chest pain.  You develop wheezing, a prolonged cough, cough up blood, or have a change in your usual mucus (if you have chronic lung disease).  You develop sore muscles or a stiff neck. Document Released: 03/09/2001 Document Revised: 12/06/2011 Document Reviewed: 12/19/2013 Exit[employer] Patient Information 2015 [employer]. This information is not intended to replace advice given to you by your health care provider. Make sure you discuss any questions you have with your health care provider.  

## 2014-11-22 NOTE — ED Notes (Signed)
Sore throat, cough, cold symptoms x 4 days.  Headache x 2 days.  Sore throat worse today.

## 2014-11-25 LAB — CULTURE, GROUP A STREP: Strep A Culture: NEGATIVE

## 2015-02-05 ENCOUNTER — Emergency Department (HOSPITAL_BASED_OUTPATIENT_CLINIC_OR_DEPARTMENT_OTHER)
Admission: EM | Admit: 2015-02-05 | Discharge: 2015-02-05 | Disposition: A | Discharge status: Home / Self Care | Payer: Medicaid Other | Attending: Emergency Medicine | Admitting: Emergency Medicine

## 2015-02-05 ENCOUNTER — Encounter (HOSPITAL_BASED_OUTPATIENT_CLINIC_OR_DEPARTMENT_OTHER): Payer: Self-pay | Admitting: *Deleted

## 2015-02-05 DIAGNOSIS — Z3202 Encounter for pregnancy test, result negative: Secondary | ICD-10-CM | POA: Insufficient documentation

## 2015-02-05 DIAGNOSIS — Z720 Tobacco use: Secondary | ICD-10-CM | POA: Diagnosis not present

## 2015-02-05 DIAGNOSIS — R1031 Right lower quadrant pain: Secondary | ICD-10-CM

## 2015-02-05 DIAGNOSIS — Z8709 Personal history of other diseases of the respiratory system: Secondary | ICD-10-CM | POA: Insufficient documentation

## 2015-02-05 LAB — CBC WITH DIFFERENTIAL/PLATELET
Basophils Absolute: 0 10*3/uL (ref 0.0–0.1)
Basophils Relative: 0 % (ref 0–1)
EOS ABS: 0.3 10*3/uL (ref 0.0–0.7)
Eosinophils Relative: 2 % (ref 0–5)
HCT: 41.2 % (ref 36.0–46.0)
Hemoglobin: 13.8 g/dL (ref 12.0–15.0)
LYMPHS ABS: 4.6 10*3/uL — AB (ref 0.7–4.0)
Lymphocytes Relative: 33 % (ref 12–46)
MCH: 31.4 pg (ref 26.0–34.0)
MCHC: 33.5 g/dL (ref 30.0–36.0)
MCV: 93.8 fL (ref 78.0–100.0)
MONO ABS: 1.5 10*3/uL — AB (ref 0.1–1.0)
MONOS PCT: 11 % (ref 3–12)
Neutro Abs: 7.5 10*3/uL (ref 1.7–7.7)
Neutrophils Relative %: 54 % (ref 43–77)
Platelets: 234 10*3/uL (ref 150–400)
RBC: 4.39 MIL/uL (ref 3.87–5.11)
RDW: 13.4 % (ref 11.5–15.5)
WBC: 13.9 10*3/uL — ABNORMAL HIGH (ref 4.0–10.5)

## 2015-02-05 LAB — URINALYSIS, ROUTINE W REFLEX MICROSCOPIC
Bilirubin Urine: NEGATIVE
Glucose, UA: NEGATIVE mg/dL
Hgb urine dipstick: NEGATIVE
Ketones, ur: NEGATIVE mg/dL
Leukocytes, UA: NEGATIVE
Nitrite: NEGATIVE
PH: 7 (ref 5.0–8.0)
Protein, ur: NEGATIVE mg/dL
SPECIFIC GRAVITY, URINE: 1.019 (ref 1.005–1.030)
Urobilinogen, UA: 1 mg/dL (ref 0.0–1.0)

## 2015-02-05 LAB — BASIC METABOLIC PANEL
Anion gap: 8 (ref 5–15)
BUN: 12 mg/dL (ref 6–20)
CO2: 27 mmol/L (ref 22–32)
CREATININE: 0.86 mg/dL (ref 0.44–1.00)
Calcium: 9.3 mg/dL (ref 8.9–10.3)
Chloride: 103 mmol/L (ref 101–111)
GFR calc Af Amer: >60 mL/min (ref >60)
GFR calc non Af Amer: >60 mL/min (ref >60)
GLUCOSE: 110 mg/dL — AB (ref 70–99)
POTASSIUM: 3.8 mmol/L (ref 3.5–5.1)
Sodium: 138 mmol/L (ref 135–145)

## 2015-02-05 LAB — PREGNANCY, URINE: PREG TEST UR: NEGATIVE

## 2015-02-05 MED ORDER — IBUPROFEN 800 MG PO TABS
800.0000 mg | ORAL_TABLET | Freq: Once | ORAL | Status: AC
  Administered 2015-02-05: 800 mg via ORAL
  Filled 2015-02-05: qty 1

## 2015-02-05 MED ORDER — ONDANSETRON 8 MG PO TBDP
8.0000 mg | ORAL_TABLET | Freq: Once | ORAL | Status: AC
  Administered 2015-02-05: 8 mg via ORAL
  Filled 2015-02-05: qty 1

## 2015-02-05 MED ORDER — OXYCODONE-ACETAMINOPHEN 5-325 MG PO TABS: 1.0000 | ORAL_TABLET | Freq: Three times a day (TID) | ORAL | Status: DC | PRN

## 2015-02-05 NOTE — ED Provider Notes (Signed)
CSN: [id number]     Arrival date & time 02/05/15  1743 History   First MD Initiated Contact with Patient 02/05/15 1755     This chart was scribed for Anne Booth Anne Lawhorn, MD by Arlan OrganAshley Leger, ED Scribe. This patient was seen in room MH10/MH10 and the patient's care was started 6:25 PM.   Chief Complaint  Patient presents with  . Abdominal Pain   Patient is a 39 y.o. female presenting with abdominal pain. The history is provided by the patient. No language interpreter was used.  Abdominal Pain Pain location:  RLQ Pain quality: aching and dull   Pain radiates to:  Back Pain severity:  Moderate Onset quality:  Gradual Duration:  4 days Timing:  Constant Progression:  Worsening Chronicity:  New Context: not sick contacts   Relieved by:  Nothing Worsened by:  Position changes Ineffective treatments:  NSAIDs and OTC medications Associated symptoms: no chills, no constipation, no diarrhea, no fever, no nausea and no vomiting     HPI Comments: Anne Booth is a 39 y.o. female without any pertinent past medical history who presents to the Emergency Department complaining of intermittent, ongoing RLQ abdominal pain that radiates to the back x 4 days, constant in last 2 days. Pain is described as dull/ache. Discomfort is exacerbated when standing without any alleviating factors. She has tried OTC Ibuprofen without any improvement for symptoms. No recent nausea, vomiting, diarrhea, or constipation. No personal history of kidney stones, however, she admits to a family history of stones. [name] denies a history of abdominal surgeries. LNMP 4/21. No known allergies to medications. She reports h/o ovarian cyst She has had an appetite  Past Medical History  Diagnosis Date  . Sinus infection    Past Surgical History  Procedure Laterality Date  . Tonsillectomy     History reviewed. No pertinent family history. History  Substance Use Topics  . Smoking status: Current Every Day Smoker -- 1.00  packs/day    Types: Cigarettes  . Smokeless tobacco: Never Used  . Alcohol Use: No   OB History    No data available     Review of Systems  Constitutional: Negative for fever and chills.  Gastrointestinal: Positive for abdominal pain. Negative for nausea, vomiting, diarrhea and constipation.  Skin: Negative for rash.  Psychiatric/Behavioral: Negative for confusion.  All other systems reviewed and are negative.     Allergies  Review of patient's allergies indicates no known allergies.  Home Medications   Prior to Admission medications   Not on File   Triage Vitals: BP 130/77 mmHg  Pulse 75  Temp(Src) 98.1 F (36.7 C) (Oral)  Resp 18  Ht 5\' 11"  (1.803 m)  Wt 260 lb (117.935 kg)  BMI 36.28 kg/m2  SpO2 97%  LMP 01/16/2015   Physical Exam  CONSTITUTIONAL: Well developed/well nourished HEAD: Normocephalic/atraumatic EYES: EOMI/PERRL ENMT: Mucous membranes moist NECK: supple no meningeal signs SPINE/BACK:entire spine nontender CV: S1/S2 noted, no murmurs/rubs/gallops noted LUNGS: Lungs are clear to auscultation bilaterally, no apparent distress ABDOMEN: soft, mild RLQ tenderness, no rebound or guarding, bowel sounds noted throughout abdomen GU: Right cva tenderness, no CMT, no adnexal tenderness/mass, no vag bleeding/discharge, nurse present for exam NEURO: Pt is awake/alert/appropriate, moves all extremitiesx4.  No facial droop.   EXTREMITIES: pulses normal/equal, full ROM SKIN: warm, color normal PSYCH: no abnormalities of mood noted, alert and oriented to situation   ED Course  Procedures  Medications  ibuprofen (ADVIL,MOTRIN) tablet 800 mg (800 mg Oral Given  02/05/15 1839)  ondansetron (ZOFRAN-ODT) disintegrating tablet 8 mg (8 mg Oral Given 02/05/15 1839)    DIAGNOSTIC STUDIES: Oxygen Saturation is 97% on RA, adequate by my interpretation.    COORDINATION OF CARE: 6:26 PM- Will give Ibuprofen and Zofran. Will order BMP, CBC, Urinalysis, and pregnancy  urine. Discussed treatment plan with pt at bedside and pt agreed to plan.    7:55 PM  Pt well appearing Given history/exam, I doubt acute appendicitis (WBC elevated but no fever/vomiting, pain ongoing for days, she has an appetite, no peritoneal signs) Suspect possible ov. Cyst given previous history. She declines pelvic US and I doubt this represents acute torsion/TOA.  She denies h/o vag discharge or pelvic infection We discussed strict return precautions  Labs Review Labs Reviewed  BASIC METABOLIC PANEL - Abnormal; Notable for the following:    Glucose, Bld 110 (*)    All other components within normal limits  CBC WITH DIFFERENTIAL/PLATELET - Abnormal; Notable for the following:    WBC 13.9 (*)    Lymphs Abs 4.6 (*)    Monocytes Absolute 1.5 (*)    All other components within normal limits  URINALYSIS, ROUTINE W REFLEX MICROSCOPIC  PREGNANCY, URINE     MDM   Final diagnoses:  None    Nursing notes including past medical history and social history reviewed and considered in documentation Labs/vital reviewed myself and considered during evaluation   I personally performed the services described in this documentation, which was scribed in my presence. The recorded information has been reviewed and is accurate.      Anne Booth Hensley Aziz, MD 02/05/15 [phone]

## 2015-02-05 NOTE — ED Notes (Signed)
Pt c/o right lower abd pain x 4 days  

## 2015-02-05 NOTE — ED Notes (Signed)
MD at bedside. 

## 2015-05-07 IMAGING — CT CT ABD-PELV W/O CM
2 of 4 series · 17 of 46 positions shown, 19 images · non-contrast
Comparison: None

CLINICAL DATA: Right flank pain and lower abdominal pain

EXAM:
CT ABDOMEN AND PELVIS WITHOUT CONTRAST
TECHNIQUE: Multidetector CT imaging of the abdomen and pelvis was performed
following the standard protocol without intravenous contrast.

[Series 2: renal stone > 200 lbs 5.0 b31f · axial · 0.98mm/px · z∈[-502,-52]mm · 14 of 100 slices shown, 16 images]
[im 5/100  soft-tissue]
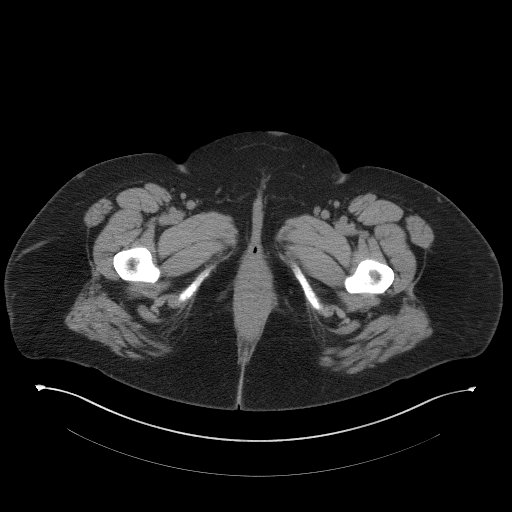
[im 5/100  bone]
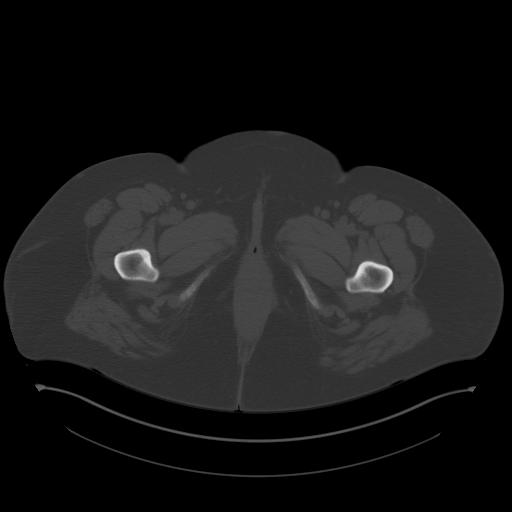
[im 13/100  soft-tissue]
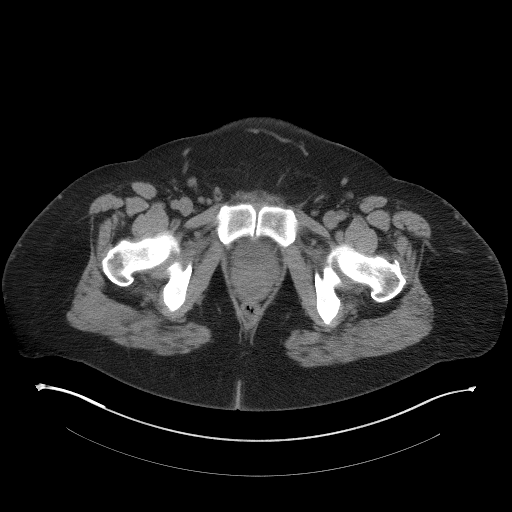
[im 18/100  soft-tissue]
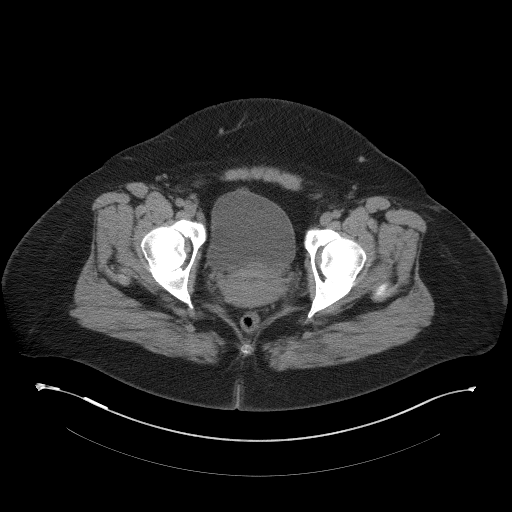
[im 26/100  soft-tissue]
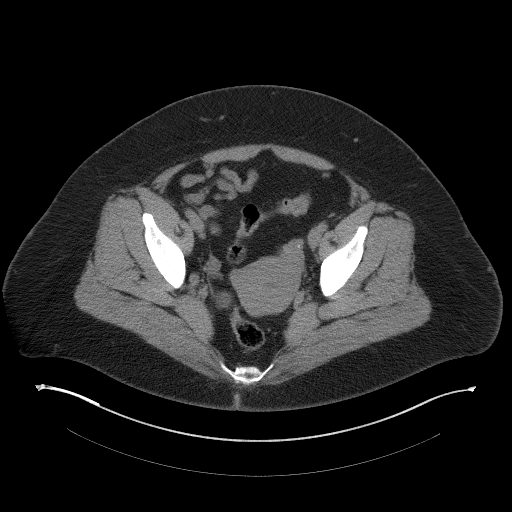
[im 35/100  soft-tissue]
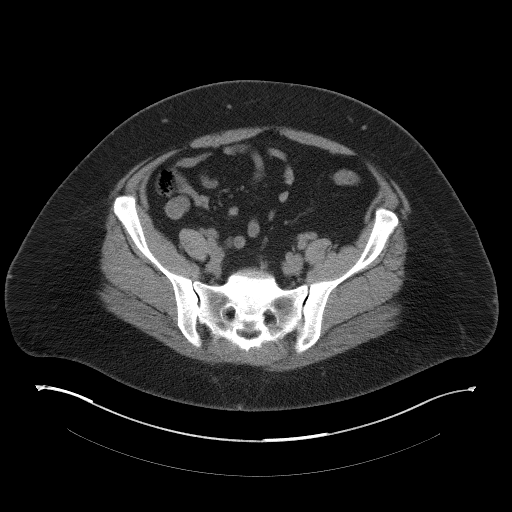
[im 39/100  soft-tissue]
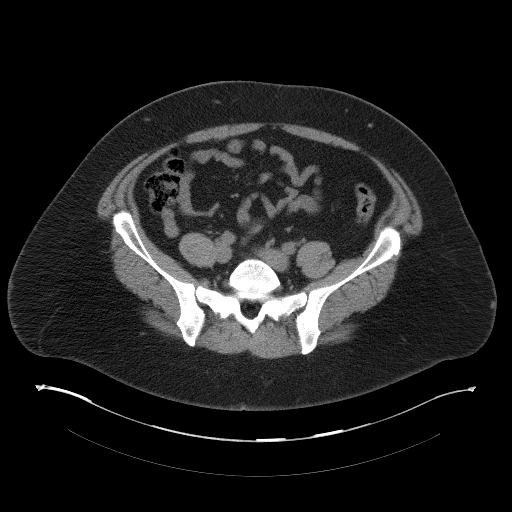
[im 48/100  soft-tissue]
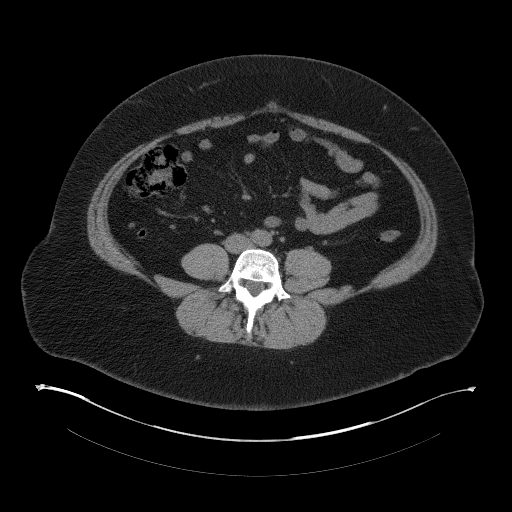
[im 52/100  soft-tissue]
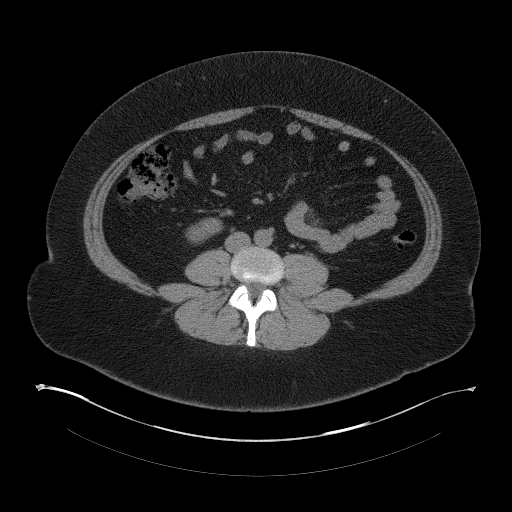
[im 61/100  soft-tissue]
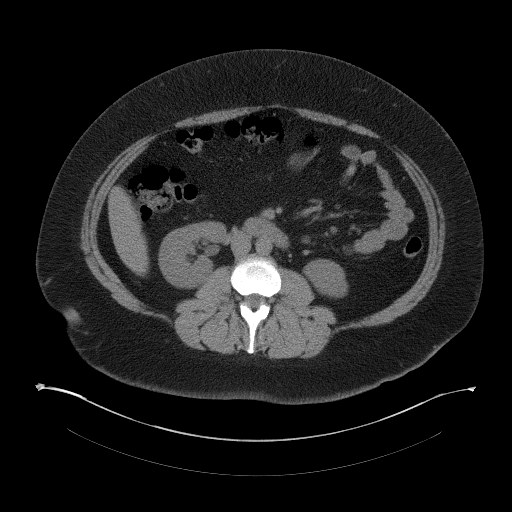
[im 61/100  bone]
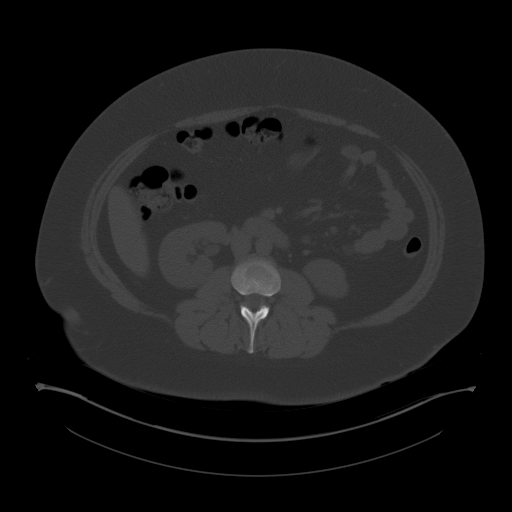
[im 65/100  soft-tissue]
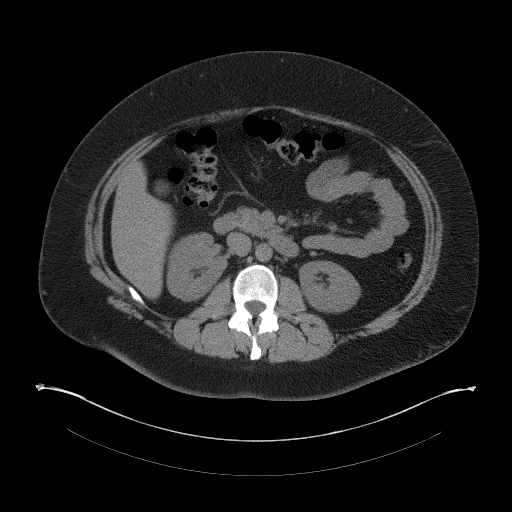
[im 74/100  soft-tissue]
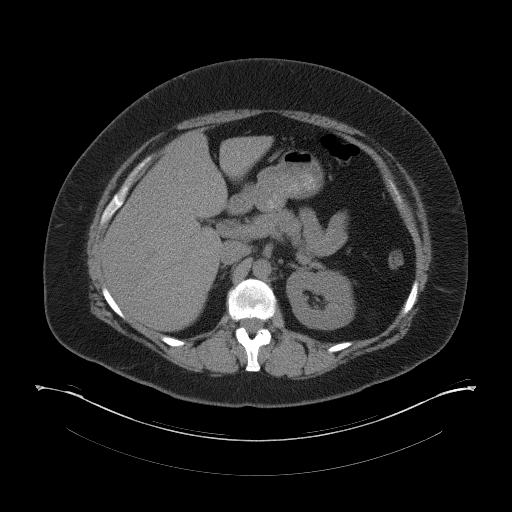
[im 82/100  soft-tissue]
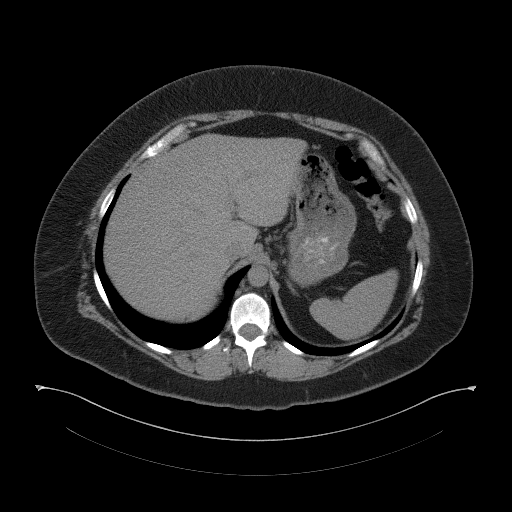
[im 87/100  soft-tissue]
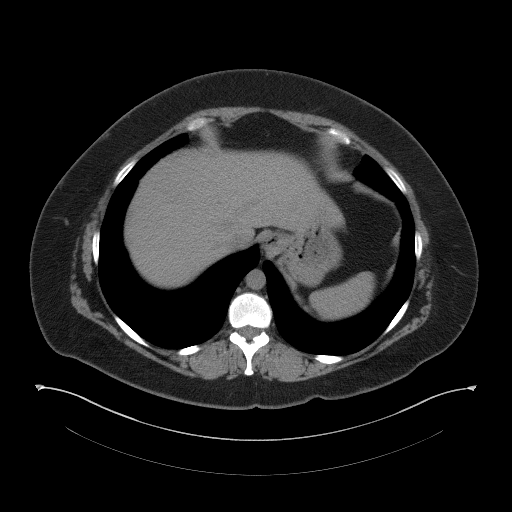
[im 95/100  soft-tissue]
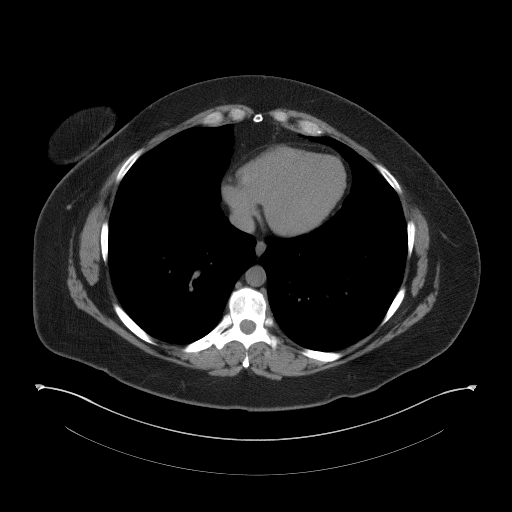

[Series 5: renal stone 3.0 coronal · coronal · 0.98mm/px · 3 of 109 slices shown]
[im 37/109  soft-tissue]
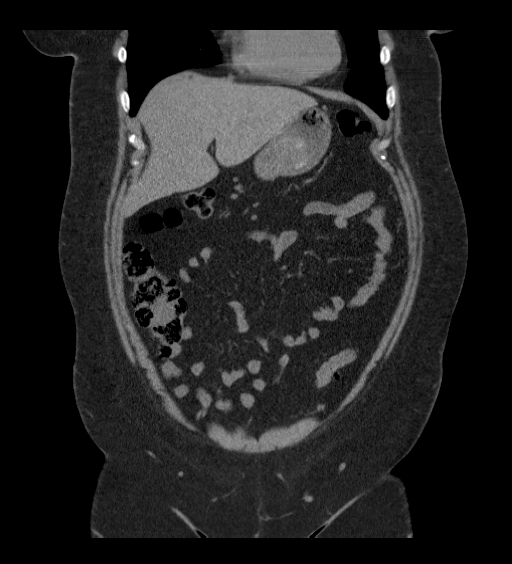
[im 49/109  soft-tissue]
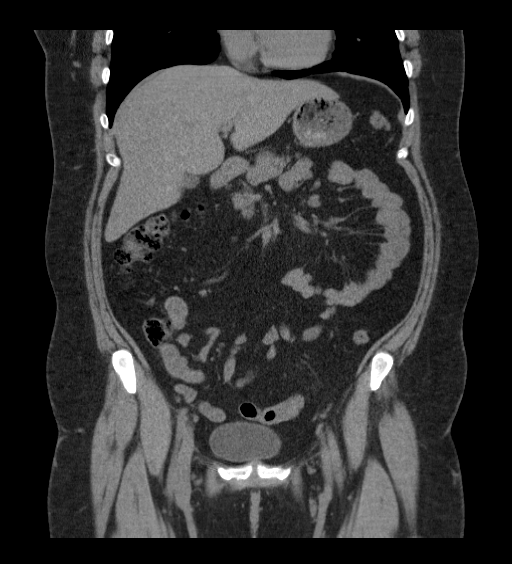
[im 61/109  soft-tissue]
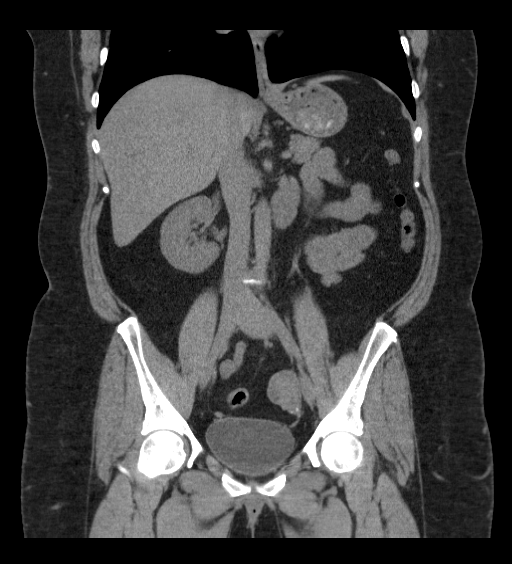

[17 of 46 positions shown; findings below may reference images not displayed]

FINDINGS: There is no pleural effusion identified. The lung bases appear
clear.

No focal liver abnormality identified. The gallbladder appears
normal. No biliary dilatation. The pancreas appears normal. The
spleen is unremarkable.

Normal appearance of the adrenal glands. There is a stone within the
inferior pole of the right kidney measuring 2 mm, image 45/ series
2. No right-sided hydronephrosis or hydroureter. The left kidney
appears normal. No left-sided nephrolithiasis or hydronephrosis. The
urinary bladder appears within normal limits. The uterus and the
adnexal structures have a normal physiologic appearance for
patient's age.

Normal caliber of the abdominal aorta. There is no aneurysm. There
is no upper abdominal adenopathy.

The stomach and small bowel loops appear normal. The appendix is
visualized and appears normal. Normal appearance of the proximal
colon. A few scattered distal colonic diverticula noted without
acute inflammation.

No free fluid or fluid collections identified within the abdomen or
pelvis.

Review of the visualized osseous structures is negative.
IMPRESSION: 1. No acute findings.
2. Nonobstructing right renal calculus.

## 2016-07-15 ENCOUNTER — Encounter (HOSPITAL_BASED_OUTPATIENT_CLINIC_OR_DEPARTMENT_OTHER): Payer: Self-pay | Admitting: *Deleted

## 2016-07-15 ENCOUNTER — Emergency Department (HOSPITAL_BASED_OUTPATIENT_CLINIC_OR_DEPARTMENT_OTHER): Payer: Medicaid Other

## 2016-07-15 ENCOUNTER — Emergency Department (HOSPITAL_BASED_OUTPATIENT_CLINIC_OR_DEPARTMENT_OTHER)
Admission: EM | Admit: 2016-07-15 | Discharge: 2016-07-15 | Disposition: A | Discharge status: Home / Self Care | Payer: Medicaid Other | Attending: Emergency Medicine | Admitting: Emergency Medicine

## 2016-07-15 DIAGNOSIS — R200 Anesthesia of skin: Secondary | ICD-10-CM | POA: Insufficient documentation

## 2016-07-15 DIAGNOSIS — R52 Pain, unspecified: Secondary | ICD-10-CM

## 2016-07-15 DIAGNOSIS — F17210 Nicotine dependence, cigarettes, uncomplicated: Secondary | ICD-10-CM | POA: Diagnosis not present

## 2016-07-15 DIAGNOSIS — M545 Low back pain: Secondary | ICD-10-CM | POA: Diagnosis present

## 2016-07-15 DIAGNOSIS — M5432 Sciatica, left side: Secondary | ICD-10-CM

## 2016-07-15 DIAGNOSIS — M5442 Lumbago with sciatica, left side: Secondary | ICD-10-CM | POA: Insufficient documentation

## 2016-07-15 DIAGNOSIS — M5430 Sciatica, unspecified side: Secondary | ICD-10-CM

## 2016-07-15 MED ORDER — LORAZEPAM 1 MG PO TABS: 1.0000 mg | ORAL_TABLET | Freq: Three times a day (TID) | ORAL | 0 refills | Status: AC

## 2016-07-15 MED ORDER — IBUPROFEN 600 MG PO TABS: 600.0000 mg | ORAL_TABLET | Freq: Four times a day (QID) | ORAL | 0 refills | Status: AC | PRN

## 2016-07-15 MED ORDER — TRAMADOL HCL 50 MG PO TABS: 50.0000 mg | ORAL_TABLET | Freq: Four times a day (QID) | ORAL | 0 refills | Status: AC | PRN

## 2016-07-15 MED FILL — IBUPROFEN 600 MG TABLET: 600 | 7 days supply | Qty: 30 | Fill #0

## 2016-07-15 MED FILL — traMADol HCL 50 MG TABS: 50 | 6 days supply | Qty: 20 | Fill #0

## 2016-07-15 MED FILL — LORazepam 1 MG TABS: 1 | 3 days supply | Qty: 10 | Fill #0

## 2016-07-15 NOTE — ED Notes (Signed)
Pt returned from MRI, states she was unable to complete it due to anxiety. Pt advises she does not think she will be able to do it, even with medication to relax her. MD made aware.

## 2016-07-15 NOTE — ED Triage Notes (Signed)
Pt reports low back pain radiating into left buttock x 9 days. Denies injury. Seen at Urgent Care on Monday and taking flexeril and naproxen without relief

## 2016-07-15 NOTE — ED Notes (Signed)
Patient transported to MRI 

## 2016-07-25 NOTE — ED Provider Notes (Signed)
[facility]-EMERGENCY DEPT Provider Note   CSN: [id number] Arrival date & time: 07/15/16  0850     History   Chief Complaint Chief Complaint  Patient presents with  . Back Pain    HPI Anne Booth is a 40 y.o. female.  HPI Pt reports low back pain radiating into left buttock x 9 days. Denies injury. Seen at Urgent Care on Monday and taking flexeril and naproxen without relief Past Medical History:  Diagnosis Date  . Sinus infection     There are no active problems to display for this patient.   Past Surgical History:  Procedure Laterality Date  . TONSILLECTOMY      OB History    No data available       Home Medications    Prior to Admission medications   Medication Sig Start Date End Date Taking? Authorizing Provider  ibuprofen (ADVIL,MOTRIN) 600 MG tablet Take 1 tablet (600 mg total) by mouth every 6 (six) hours as needed. 07/15/16   Nelva Nayobert Taneka Espiritu, MD  LORazepam (ATIVAN) 1 MG tablet Take 1 tablet (1 mg total) by mouth every 8 (eight) hours. Take 1 hour prior to MR scan 07/15/16   Nelva Nayobert Francis Doenges, MD  traMADol (ULTRAM) 50 MG tablet Take 1 tablet (50 mg total) by mouth every 6 (six) hours as needed. 07/15/16   Nelva Nayobert Ariz Terrones, MD    Family History No family history on file.  Social History Social History  Substance Use Topics  . Smoking status: Current Every Day Smoker    Packs/day: 1.00    Types: Cigarettes  . Smokeless tobacco: Never Used  . Alcohol use No     Allergies   Review of patient's allergies indicates no known allergies.   Review of Systems Review of Systems  Musculoskeletal: Positive for back pain.  Neurological: Positive for numbness. Negative for speech difficulty and headaches.  All other systems reviewed and are negative.    Physical Exam Updated Vital Signs BP 137/94 (BP Location: Right Arm)   Pulse 76   Temp 97.6 F (36.4 C) (Oral)   Resp 18   Ht 5\' 11"  (1.803 m)   Wt 261 lb (118.4 kg)   LMP 06/18/2016   SpO2 98%   BMI 36.40  kg/m   Physical Exam  Constitutional: Anne Booth is oriented to person, place, and time. Anne Booth appears well-developed and well-nourished. No distress.  HENT:  Head: Normocephalic and atraumatic.  Eyes: Pupils are equal, round, and reactive to light.  Neck: Normal range of motion.  Cardiovascular: Normal rate and intact distal pulses.   Pulmonary/Chest: No respiratory distress.  Abdominal: Normal appearance. Anne Booth exhibits no distension.  Musculoskeletal: Normal range of motion.       Back:  Neurological: Anne Booth is alert and oriented to person, place, and time. No cranial nerve deficit.  Skin: Skin is warm and dry. No rash noted.  Psychiatric: Anne Booth has a normal mood and affect. Her behavior is normal.  Nursing note and vitals reviewed.    ED Treatments / Results  Labs (all labs ordered are listed, but only abnormal results are displayed) Labs Reviewed - No data to display  EKG  EKG Interpretation None       Radiology No results found.  Procedures Procedures (including critical care time)  Medications Ordered in ED Medications - No data to display   Initial Impression / Assessment and Plan / ED Course  I have reviewed the triage vital signs and the nursing notes.  Pertinent labs & imaging  results that were available during my care of the patient were reviewed by me and considered in my medical decision making (see chart for details).  Clinical Course  Patient unable to tolerate MRI.  Will treat symptomatically    Final Clinical Impressions(s) / ED Diagnoses   Final diagnoses:  Pain  Sciatica of left side  Sciatica    New Prescriptions Discharge Medication List as of 07/15/2016 10:33 AM    START taking these medications   Details  ibuprofen (ADVIL,MOTRIN) 600 MG tablet Take 1 tablet (600 mg total) by mouth every 6 (six) hours as needed., Starting Thu 07/15/2016, Print    LORazepam (ATIVAN) 1 MG tablet Take 1 tablet (1 mg total) by mouth every 8 (eight) hours. Take 1  hour prior to MR scan, Starting Thu 07/15/2016, Print    traMADol (ULTRAM) 50 MG tablet Take 1 tablet (50 mg total) by mouth every 6 (six) hours as needed., Starting Thu 07/15/2016, Print         Nelva Nayobert Vaani Morren, MD 07/25/16 2036

## 2019-12-31 ENCOUNTER — Ambulatory Visit: Payer: Medicaid Other | Attending: Internal Medicine

## 2019-12-31 DIAGNOSIS — Z23 Encounter for immunization: Secondary | ICD-10-CM

## 2019-12-31 NOTE — Progress Notes (Signed)
   Covid-19 Vaccination Clinic  Name:  Anne Booth    MRN: [id number] DOB: 18-Jul-1976  12/31/2019  Ms. Anne Booth was observed post Covid-19 immunization for 15 minutes without incident. She was provided with Vaccine Information Sheet and instruction to access the V-Safe system.   Ms. Anne Booth was instructed to call 911 with any severe reactions post vaccine: Marland Kitchen Difficulty breathing  . Swelling of face and throat  . A fast heartbeat  . A bad rash all over body  . Dizziness and weakness   Immunizations Administered    Name Date Dose VIS Date Route   Pfizer COVID-19 Vaccine 12/31/2019  1:08 PM 0.3 mL 09/07/2019 Intramuscular   Manufacturer: [employer], [employer]   Lot: [id number]   NDC: [address]-3

## 2020-01-22 ENCOUNTER — Ambulatory Visit: Payer: Medicaid Other | Attending: Internal Medicine

## 2020-01-22 DIAGNOSIS — Z23 Encounter for immunization: Secondary | ICD-10-CM

## 2020-01-22 NOTE — Progress Notes (Signed)
   Covid-19 Vaccination Clinic  Name:  Anne Booth    MRN: [id number] DOB: [birth year]  01/22/2020  Anne Booth was observed post Covid-19 immunization for 15 minutes without incident. She was provided with Vaccine Information Sheet and instruction to access the V-Safe system.   Anne Booth was instructed to call 911 with any severe reactions post vaccine: Marland Kitchen Difficulty breathing  . Swelling of face and throat  . A fast heartbeat  . A bad rash all over body  . Dizziness and weakness   Immunizations Administered    Name Date Dose VIS Date Route   Pfizer COVID-19 Vaccine 01/22/2020 11:24 AM 0.3 mL 11/21/2018 Intramuscular   Manufacturer: [employer], [employer]   Lot: [id number]   NDC: [address]-4

## 2022-10-19 ENCOUNTER — Emergency Department (HOSPITAL_BASED_OUTPATIENT_CLINIC_OR_DEPARTMENT_OTHER): Admit: 2022-10-19 | Discharge: 2022-10-19 | Disposition: A | Discharge status: Home / Self Care | Payer: Medicaid Other

## 2022-10-19 ENCOUNTER — Encounter (HOSPITAL_COMMUNITY): Payer: Self-pay | Admitting: Emergency Medicine

## 2022-10-19 ENCOUNTER — Other Ambulatory Visit: Payer: Self-pay

## 2022-10-19 ENCOUNTER — Emergency Department (HOSPITAL_COMMUNITY)
Admission: EM | Admit: 2022-10-19 | Discharge: 2022-10-19 | Disposition: A | Discharge status: Home / Self Care | Payer: Medicaid Other | Attending: Emergency Medicine | Admitting: Emergency Medicine

## 2022-10-19 DIAGNOSIS — M7989 Other specified soft tissue disorders: Secondary | ICD-10-CM

## 2022-10-19 DIAGNOSIS — M79605 Pain in left leg: Secondary | ICD-10-CM

## 2022-10-19 LAB — CBC WITH DIFFERENTIAL/PLATELET
Abs Immature Granulocytes: 0.04 10*3/uL (ref 0.00–0.07)
Basophils Absolute: 0.1 10*3/uL (ref 0.0–0.1)
Basophils Relative: 1 %
Eosinophils Absolute: 0.2 10*3/uL (ref 0.0–0.5)
Eosinophils Relative: 1 %
HCT: 40.4 % (ref 36.0–46.0)
Hemoglobin: 13.6 g/dL (ref 12.0–15.0)
Immature Granulocytes: 0 %
Lymphocytes Relative: 24 %
Lymphs Abs: 3.2 10*3/uL (ref 0.7–4.0)
MCH: 31.6 pg (ref 26.0–34.0)
MCHC: 33.7 g/dL (ref 30.0–36.0)
MCV: 94 fL (ref 80.0–100.0)
Monocytes Absolute: 1.1 10*3/uL — ABNORMAL HIGH (ref 0.1–1.0)
Monocytes Relative: 8 %
Neutro Abs: 8.7 10*3/uL — ABNORMAL HIGH (ref 1.7–7.7)
Neutrophils Relative %: 66 %
Platelets: 244 10*3/uL (ref 150–400)
RBC: 4.3 MIL/uL (ref 3.87–5.11)
RDW: 13.5 % (ref 11.5–15.5)
WBC: 13.2 10*3/uL — ABNORMAL HIGH (ref 4.0–10.5)
nRBC: 0 % (ref 0.0–0.2)

## 2022-10-19 LAB — BASIC METABOLIC PANEL
Anion gap: 8 (ref 5–15)
BUN: 11 mg/dL (ref 6–20)
CO2: 23 mmol/L (ref 22–32)
Calcium: 9.3 mg/dL (ref 8.9–10.3)
Chloride: 106 mmol/L (ref 98–111)
Creatinine, Ser: 0.86 mg/dL (ref 0.44–1.00)
GFR, Estimated: >60 mL/min (ref >60)
Glucose, Bld: 94 mg/dL (ref 70–99)
Potassium: 3.8 mmol/L (ref 3.5–5.1)
Sodium: 137 mmol/L (ref 135–145)

## 2022-10-19 NOTE — ED Triage Notes (Signed)
Pt complains of "knot of the back of left thigh" x 3 weeks. Went to PCP today and was told to get further evaluated for blood clot. Pain 9/10. No meds PTA.

## 2022-10-19 NOTE — ED Provider Triage Note (Signed)
Emergency Medicine Provider Triage Evaluation Note  Anne Booth , a 47 y.o. female  was evaluated in triage.  Pt complains of knot to the back of the left thigh x 3 weeks. Was evaluated by her PCP and told to come into the ED for DVT rule out. No anticoagulant use. No meds tried PTA. Denies chest pain, shortness of breath.   Review of Systems  Positive:  Negative:   Physical Exam  BP (!) 151/106 (BP Location: Right Arm)   Pulse 95   Temp 99.1 F (37.3 C) (Oral)   Resp 16   Ht 5\' 10"  (1.778 m)   Wt 118 kg   LMP 10/12/2022 (Exact Date)   SpO2 97%   BMI 37.33 kg/m  Gen:   Awake, no distress   Resp:  Normal effort  MSK:   Moves extremities without difficulty  Other:  TTP noted to varicose vein on posterior left thigh.   Medical Decision Making  Medically screening exam initiated at 6:22 PM.  Appropriate orders placed.  Anne Booth was informed that the remainder of the evaluation will be completed by another provider, this initial triage assessment does not replace that evaluation, and the importance of remaining in the ED until their evaluation is complete.  Work-up initiated.   6:40 PM - notified DVT negative.    Abdiel Blackerby A, PA-C 10/19/22 1840

## 2022-10-19 NOTE — Progress Notes (Signed)
Left lower extremity venous duplex has been completed. Preliminary results can be found in CV Proc through chart review.  Results were given to [facility] PA.  10/19/22 6:43 PM Carlos Levering RVT

## 2022-10-19 NOTE — ED Provider Notes (Signed)
[facility] Provider Note   CSN: [id number] Arrival date & time: 10/19/22  1753     History  Chief Complaint  Patient presents with   Leg Pain    Anne Booth is a 47 y.o. female.  47 yo F with a chief complaints of left posterior leg pain.  Goes from her buttock down to the back of the knee.  This been going on for a few days now.  She thought she had injured it by prolonged sitting at her job.  She denies trauma.  Is seen her family doctor today was concerned about a possible DVT and she was sent here for evaluation.  She denies history of PE or DVT.  There is family history of the same.  Pain is worse with movement palpation and standing ambulation.   Leg Pain      Home Medications Prior to Admission medications   Medication Sig Start Date End Date Taking? Authorizing Provider  ibuprofen (ADVIL,MOTRIN) 600 MG tablet Take 1 tablet (600 mg total) by mouth every 6 (six) hours as needed. 07/15/16   Leonard Schwartz, MD  LORazepam (ATIVAN) 1 MG tablet Take 1 tablet (1 mg total) by mouth every 8 (eight) hours. Take 1 hour prior to MR scan 07/15/16   Leonard Schwartz, MD  traMADol (ULTRAM) 50 MG tablet Take 1 tablet (50 mg total) by mouth every 6 (six) hours as needed. 07/15/16   Leonard Schwartz, MD      Allergies    Patient has no known allergies.    Review of Systems   Review of Systems  Physical Exam Updated Vital Signs BP (!) 151/106 (BP Location: Right Arm)   Pulse 95   Temp 99.1 F (37.3 C) (Oral)   Resp 16   Ht 5\' 10"  (1.778 m)   Wt 118 kg   LMP 10/12/2022 (Exact Date)   SpO2 97%   BMI 37.33 kg/m  Physical Exam Vitals and nursing note reviewed.  Constitutional:      General: She is not in acute distress.    Appearance: She is well-developed. She is not diaphoretic.  HENT:     Head: Normocephalic and atraumatic.  Eyes:     Pupils: Pupils are equal, round, and reactive to light.  Cardiovascular:     Rate and Rhythm:  Normal rate and regular rhythm.     Heart sounds: No murmur heard.    No friction rub. No gallop.  Pulmonary:     Effort: Pulmonary effort is normal.     Breath sounds: No wheezing or rales.  Abdominal:     General: There is no distension.     Palpations: Abdomen is soft.     Tenderness: There is no abdominal tenderness.  Musculoskeletal:        General: Tenderness present.     Cervical back: Normal range of motion and neck supple.     Comments: Tenderness about the left hamstring.  Some fullness about the back to the left knee with some extension of varicose veins down to the mid calf.  No obvious leg edema.  Skin:    General: Skin is warm and dry.  Neurological:     Mental Status: She is alert and oriented to person, place, and time.  Psychiatric:        Behavior: Behavior normal.     ED Results / Procedures / Treatments   Labs (all labs ordered are listed, but only abnormal results  are displayed) Labs Reviewed  CBC WITH DIFFERENTIAL/PLATELET - Abnormal; Notable for the following components:      Result Value   WBC 13.2 (*)    Neutro Abs 8.7 (*)    Monocytes Absolute 1.1 (*)    All other components within normal limits  BASIC METABOLIC PANEL    EKG None  Radiology VAS [place] LOWER EXTREMITY VENOUS (DVT) (7a-7p)  Result Date: 10/19/2022  Lower Venous DVT Study Patient Name:  Anne Booth  Date of Exam:   10/19/2022 Medical Rec #: [id number]    Accession #:    [id number] Date of Birth: Mar 28, 1976    Patient Gender: F Patient Age:   15 years Exam Location:  [facility] Procedure:      VAS [place] LOWER EXTREMITY VENOUS (DVT) Referring Phys: Velda Shell BLUE --------------------------------------------------------------------------------  Indications: Swelling.  Risk Factors: None identified. Comparison Study: No prior studies. Performing Technologist: Chanda Busing RVT  Examination Guidelines: A complete evaluation includes B-mode imaging, spectral Doppler, color Doppler, and power  Doppler as needed of all accessible portions of each vessel. Bilateral testing is considered an integral part of a complete examination. Limited examinations for reoccurring indications may be performed as noted. The reflux portion of the exam is performed with the patient in reverse Trendelenburg.  +-----+---------------+---------+-----------+----------+--------------+ RIGHTCompressibilityPhasicitySpontaneityPropertiesThrombus Aging +-----+---------------+---------+-----------+----------+--------------+ CFV  Full           Yes      Yes                                 +-----+---------------+---------+-----------+----------+--------------+   +---------+---------------+---------+-----------+----------+--------------+ LEFT     CompressibilityPhasicitySpontaneityPropertiesThrombus Aging +---------+---------------+---------+-----------+----------+--------------+ CFV      Full           Yes      Yes                                 +---------+---------------+---------+-----------+----------+--------------+ SFJ      Full                                                        +---------+---------------+---------+-----------+----------+--------------+ FV Prox  Full                                                        +---------+---------------+---------+-----------+----------+--------------+ FV Mid   Full                                                        +---------+---------------+---------+-----------+----------+--------------+ FV DistalFull                                                        +---------+---------------+---------+-----------+----------+--------------+ PFV      Full                                                        +---------+---------------+---------+-----------+----------+--------------+  POP      Full           Yes      Yes                                  +---------+---------------+---------+-----------+----------+--------------+ PTV      Full                                                        +---------+---------------+---------+-----------+----------+--------------+ PERO     Full                                                        +---------+---------------+---------+-----------+----------+--------------+     Summary: RIGHT: - No evidence of common femoral vein obstruction.  LEFT: - There is no evidence of deep vein thrombosis in the lower extremity.  - No cystic structure found in the popliteal fossa.  *See table(s) above for measurements and observations.    Preliminary     Procedures Procedures    Medications Ordered in ED Medications - No data to display  ED Course/ Medical Decision Making/ A&P                             Medical Decision Making  47 yo F with a chief complaints of left leg pain.  Most likely this is a hamstring strain based on history and physical.  Could also be new onset sciatica.  I reviewed the patient's medical records and she was sent by her PCP after negative D-dimer to have a DVT ruled out.  Ultrasound is negative.  Will discharge home.  Treat as musculoskeletal.  She had screening blood work to assess her renal function through the MSE process, renal function is normal, mild leukocytosis with a neutrophilic predominance.  PCP follow-up.  9:07 PM:  I have discussed the diagnosis/risks/treatment options with the patient.  Evaluation and diagnostic testing in the emergency department does not suggest an emergent condition requiring admission or immediate intervention beyond what has been performed at this time.  They will follow up with PCP. We also discussed returning to the ED immediately if new or worsening sx occur. We discussed the sx which are most concerning (e.g., sudden worsening pain, fever, inability to tolerate by mouth) that necessitate immediate return. Medications administered to the  patient during their visit and any new prescriptions provided to the patient are listed below.  Medications given during this visit Medications - No data to display   The patient appears reasonably screen and/or stabilized for discharge and I doubt any other medical condition or other [facility] requiring further screening, evaluation, or treatment in the ED at this time prior to discharge.          Final Clinical Impression(s) / ED Diagnoses Final diagnoses:  Left leg pain    Rx / DC Orders ED Discharge Orders     None         Deno Etienne, DO 10/19/22 2107

## 2022-10-19 NOTE — Discharge Instructions (Signed)
Take 4 over the counter ibuprofen tablets 3 times a day or 2 over-the-counter naproxen tablets twice a day for pain. Also take tylenol 1000mg (2 extra strength) four times a day.    Follow up with your doctor in the office.  If this continues they may elect to reultrasound the leg or perhaps send you off to physical therapy.
